# Patient Record
Sex: Female | Born: 1945 | Race: White | Hispanic: No | Smoking: Never smoker
Health system: Southern US, Community
[De-identification: ages and names within clinical notes are randomized; demographics above are authoritative.]

---

## 2019-10-06 ENCOUNTER — Encounter: Payer: Self-pay | Admitting: Emergency Medicine

## 2019-10-06 ENCOUNTER — Emergency Department
Admission: EM | Admit: 2019-10-06 | Discharge: 2019-10-06 | Disposition: A | Discharge status: Home / Self Care | Payer: Medicare Other | Attending: Student in an Organized Health Care Education/Training Program | Admitting: Student in an Organized Health Care Education/Training Program

## 2019-10-06 ENCOUNTER — Other Ambulatory Visit: Payer: Self-pay

## 2019-10-06 ENCOUNTER — Emergency Department: Payer: Medicare Other

## 2019-10-06 DIAGNOSIS — Y92410 Unspecified street and highway as the place of occurrence of the external cause: Secondary | ICD-10-CM | POA: Diagnosis not present

## 2019-10-06 DIAGNOSIS — M7918 Myalgia, other site: Secondary | ICD-10-CM

## 2019-10-06 DIAGNOSIS — Y999 Unspecified external cause status: Secondary | ICD-10-CM | POA: Diagnosis not present

## 2019-10-06 DIAGNOSIS — S161XXA Strain of muscle, fascia and tendon at neck level, initial encounter: Secondary | ICD-10-CM | POA: Diagnosis not present

## 2019-10-06 DIAGNOSIS — S0990XA Unspecified injury of head, initial encounter: Secondary | ICD-10-CM | POA: Diagnosis present

## 2019-10-06 DIAGNOSIS — M791 Myalgia, unspecified site: Secondary | ICD-10-CM | POA: Insufficient documentation

## 2019-10-06 DIAGNOSIS — S022XXA Fracture of nasal bones, initial encounter for closed fracture: Secondary | ICD-10-CM | POA: Insufficient documentation

## 2019-10-06 DIAGNOSIS — Y93I9 Activity, other involving external motion: Secondary | ICD-10-CM | POA: Diagnosis not present

## 2019-10-06 MED ORDER — DIAZEPAM 2 MG PO TABS: 2.0000 mg | ORAL_TABLET | Freq: Three times a day (TID) | ORAL | 0 refills | Status: AC | PRN

## 2019-10-06 MED ORDER — IBUPROFEN 600 MG PO TABS: 600.0000 mg | ORAL_TABLET | Freq: Three times a day (TID) | ORAL | 0 refills | Status: AC | PRN

## 2019-10-06 MED ORDER — HYDROCODONE-ACETAMINOPHEN 5-325 MG PO TABS: 1.0000 | ORAL_TABLET | Freq: Four times a day (QID) | ORAL | 0 refills | Status: AC | PRN
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
## 2019-10-06 NOTE — Discharge Instructions (Addendum)
Follow-up with your primary care provider if any continued problems or concerns.  Also [facility] if you continue to have nasal problems after the swelling is down.  Use ice to your nose to reduce swelling off-and-on for the next several days.  Do not apply ice directly to your face but you may use a worse cough around the eyes and then applied to your face.  The ibuprofen is 600 mg 3 times a day with food.  A prescription was written in case she would rather by the 600 mg tablets or you can take 3 of the over-the-counter tablets to equal the same.  Diazepam 2 mg is a muscle relaxant that should not cause drowsiness and is every 8 hours for muscle spasms.  Also you have taken hydrocodone in the past which is for moderate pain should this become much worse tomorrow.  Be aware that the muscle relaxant and pain medication together could cause drowsiness and increase your risk for injury.  Return to the emergency department if any severe worsening of your symptoms or urgent concerns.
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
